# Patient Record
Sex: Female | Born: 1990 | Race: Black or African American | Hispanic: No | Marital: Married
Health system: Southern US, Community
[De-identification: ages and names within clinical notes are randomized; demographics above are authoritative.]

---
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
## 2010-03-20 ENCOUNTER — Emergency Department: Payer: Self-pay | Admitting: Emergency Medicine

## 2010-10-26 ENCOUNTER — Emergency Department: Payer: Self-pay | Admitting: Emergency Medicine

## 2011-02-06 LAB — COMPREHENSIVE METABOLIC PANEL
Alkaline Phosphatase: 61 U/L (ref 50–136)
Calcium, Total: 8.8 mg/dL (ref 8.5–10.1)
Chloride: 107 mmol/L (ref 98–107)
Co2: 23 mmol/L (ref 21–32)
EGFR (African American): >60
EGFR (Non-African Amer.): >60
Glucose: 89 mg/dL (ref 65–99)
Potassium: 4.1 mmol/L (ref 3.5–5.1)
SGOT(AST): 15 U/L (ref 15–37)
SGPT (ALT): 16 U/L
Sodium: 142 mmol/L (ref 136–145)

## 2011-02-06 LAB — DRUG SCREEN, URINE
Amphetamines, Ur Screen: NEGATIVE (ref ?–1000)
Benzodiazepine, Ur Scrn: NEGATIVE (ref ?–200)
Cannabinoid 50 Ng, Ur ~~LOC~~: NEGATIVE (ref ?–50)
MDMA (Ecstasy)Ur Screen: NEGATIVE (ref ?–500)
Methadone, Ur Screen: NEGATIVE (ref ?–300)
Phencyclidine (PCP) Ur S: NEGATIVE (ref ?–25)
Tricyclic, Ur Screen: NEGATIVE (ref ?–1000)

## 2011-02-06 LAB — CBC
MCH: 30.1 pg (ref 26.0–34.0)
MCHC: 33.6 g/dL (ref 32.0–36.0)
MCV: 90 fL (ref 80–100)
Platelet: 296 10*3/uL (ref 150–440)
RBC: 4.47 10*6/uL (ref 3.80–5.20)
RDW: 13.2 % (ref 11.5–14.5)

## 2011-02-06 LAB — PREGNANCY, URINE: Pregnancy Test, Urine: NEGATIVE m[IU]/mL

## 2011-02-06 LAB — TSH: Thyroid Stimulating Horm: 0.86 u[IU]/mL

## 2011-02-06 LAB — SALICYLATE LEVEL: Salicylates, Serum: <1.7 mg/dL

## 2011-02-06 LAB — ETHANOL: Ethanol %: <0.003 % (ref 0.000–0.080)

## 2011-02-07 ENCOUNTER — Inpatient Hospital Stay: Payer: Self-pay | Admitting: Psychiatry

## 2011-04-28 ENCOUNTER — Emergency Department: Payer: Self-pay | Admitting: Emergency Medicine

## 2011-04-28 LAB — URINALYSIS, COMPLETE
Bilirubin,UR: NEGATIVE
Ketone: NEGATIVE
Ph: 8 (ref 4.5–8.0)
Specific Gravity: 1.02 (ref 1.003–1.030)
WBC UR: 2 /HPF (ref 0–5)

## 2011-04-28 LAB — COMPREHENSIVE METABOLIC PANEL
Albumin: 3.8 g/dL (ref 3.4–5.0)
BUN: 14 mg/dL (ref 7–18)
Bilirubin,Total: 0.4 mg/dL (ref 0.2–1.0)
Calcium, Total: 8.9 mg/dL (ref 8.5–10.1)
Creatinine: 0.58 mg/dL — ABNORMAL LOW (ref 0.60–1.30)
EGFR (African American): >60
EGFR (Non-African Amer.): >60
Glucose: 92 mg/dL (ref 65–99)
Osmolality: 283 (ref 275–301)
Potassium: 4 mmol/L (ref 3.5–5.1)
SGOT(AST): 21 U/L (ref 15–37)
SGPT (ALT): 19 U/L
Sodium: 142 mmol/L (ref 136–145)

## 2011-04-28 LAB — CBC
HCT: 36.7 % (ref 35.0–47.0)
MCHC: 33 g/dL (ref 32.0–36.0)
MCV: 89 fL (ref 80–100)
Platelet: 250 10*3/uL (ref 150–440)
RDW: 13.1 % (ref 11.5–14.5)
WBC: 8.8 10*3/uL (ref 3.6–11.0)

## 2011-04-28 LAB — PREGNANCY, URINE: Pregnancy Test, Urine: NEGATIVE m[IU]/mL

## 2011-06-18 ENCOUNTER — Emergency Department: Payer: Self-pay | Admitting: *Deleted

## 2012-08-06 ENCOUNTER — Emergency Department: Payer: Self-pay | Admitting: Emergency Medicine

## 2012-08-06 LAB — CBC
HCT: 38.1 % (ref 35.0–47.0)
HGB: 12.7 g/dL (ref 12.0–16.0)
MCHC: 33.5 g/dL (ref 32.0–36.0)
MCV: 86 fL (ref 80–100)
Platelet: 341 10*3/uL (ref 150–440)
RBC: 4.43 10*6/uL (ref 3.80–5.20)
RDW: 13.2 % (ref 11.5–14.5)
WBC: 11.4 10*3/uL — ABNORMAL HIGH (ref 3.6–11.0)

## 2012-08-06 LAB — HCG, QUANTITATIVE, PREGNANCY: Beta Hcg, Quant.: 398 m[IU]/mL — ABNORMAL HIGH

## 2012-08-06 LAB — URINALYSIS, COMPLETE
Bilirubin,UR: NEGATIVE
Ph: 5 (ref 4.5–8.0)
Protein: NEGATIVE
RBC,UR: 1 /HPF (ref 0–5)
Squamous Epithelial: 8
WBC UR: 1 /HPF (ref 0–5)

## 2012-08-08 ENCOUNTER — Emergency Department: Payer: Self-pay | Admitting: Emergency Medicine

## 2012-09-19 ENCOUNTER — Emergency Department: Payer: Self-pay | Admitting: Emergency Medicine

## 2012-09-19 LAB — COMPREHENSIVE METABOLIC PANEL
Albumin: 3.4 g/dL (ref 3.4–5.0)
Anion Gap: 5 — ABNORMAL LOW (ref 7–16)
BUN: 9 mg/dL (ref 7–18)
Chloride: 107 mmol/L (ref 98–107)
Creatinine: 0.55 mg/dL — ABNORMAL LOW (ref 0.60–1.30)
EGFR (African American): >60
EGFR (Non-African Amer.): >60
Osmolality: 272 (ref 275–301)
Potassium: 3.6 mmol/L (ref 3.5–5.1)
SGOT(AST): 21 U/L (ref 15–37)
SGPT (ALT): 23 U/L (ref 12–78)
Sodium: 137 mmol/L (ref 136–145)
Total Protein: 7.5 g/dL (ref 6.4–8.2)

## 2012-09-19 LAB — CBC
MCH: 29.2 pg (ref 26.0–34.0)
MCHC: 34.6 g/dL (ref 32.0–36.0)
RBC: 4.42 10*6/uL (ref 3.80–5.20)

## 2012-09-19 LAB — URINALYSIS, COMPLETE
Bacteria: NONE SEEN
Bilirubin,UR: NEGATIVE
Glucose,UR: NEGATIVE mg/dL (ref 0–75)
Ketone: NEGATIVE
Nitrite: NEGATIVE
Ph: 6 (ref 4.5–8.0)
Protein: NEGATIVE
RBC,UR: <1 /HPF (ref 0–5)
Specific Gravity: 1.018 (ref 1.003–1.030)
Squamous Epithelial: 1

## 2012-09-19 LAB — HCG, QUANTITATIVE, PREGNANCY: Beta Hcg, Quant.: 104828 m[IU]/mL — ABNORMAL HIGH

## 2012-09-20 LAB — GC/CHLAMYDIA PROBE AMP

## 2012-09-20 LAB — WET PREP, GENITAL

## 2013-03-09 ENCOUNTER — Observation Stay: Payer: Self-pay | Admitting: Obstetrics and Gynecology

## 2013-03-09 ENCOUNTER — Emergency Department: Payer: Self-pay | Admitting: Emergency Medicine

## 2013-03-09 LAB — URINALYSIS, COMPLETE
BILIRUBIN, UR: NEGATIVE
BLOOD: NEGATIVE
GLUCOSE, UR: NEGATIVE mg/dL (ref 0–75)
LEUKOCYTE ESTERASE: NEGATIVE
Nitrite: NEGATIVE
Ph: 6 (ref 4.5–8.0)
RBC,UR: <1 /HPF (ref 0–5)
Specific Gravity: 1.019 (ref 1.003–1.030)
Squamous Epithelial: 6

## 2013-03-13 IMAGING — US US PELV - US TRANSVAGINAL
1 series · 17 of 25 positions shown · non-contrast
Comparison: none

REASON FOR EXAM: pelvic pain
COMMENTS:

[Series 1: us pelv - us transvaginal · 17 of 126 slices shown]
[im 1/126]
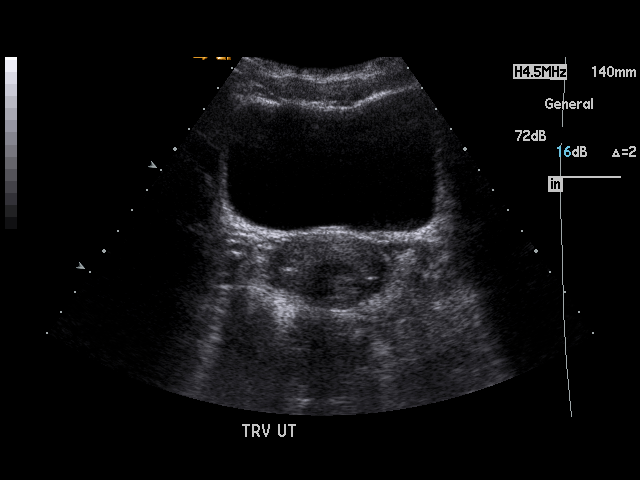
[im 11/126]
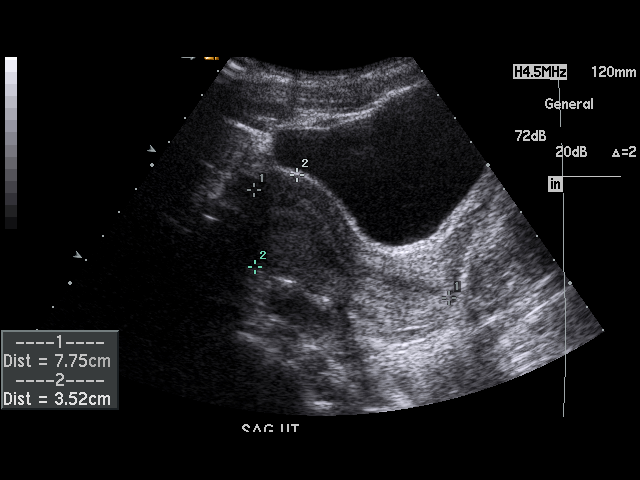
[im 16/126]
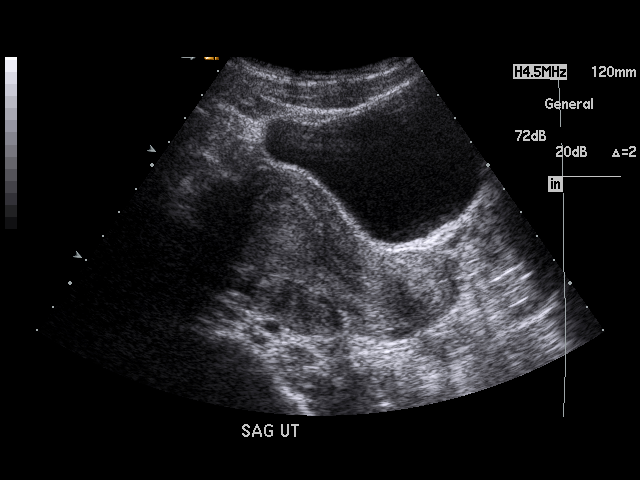
[im 27/126]
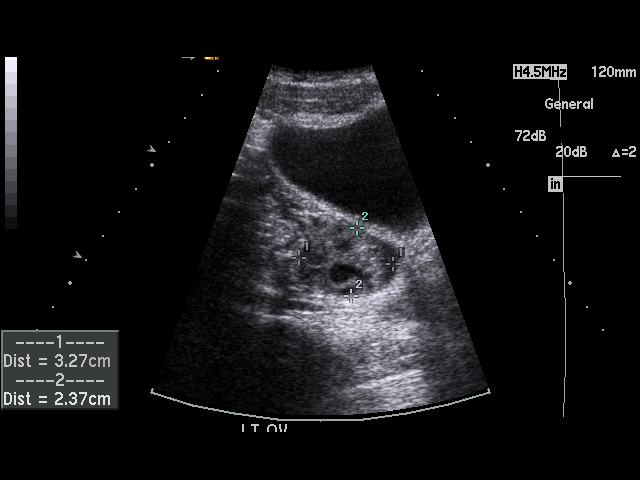
[im 32/126]
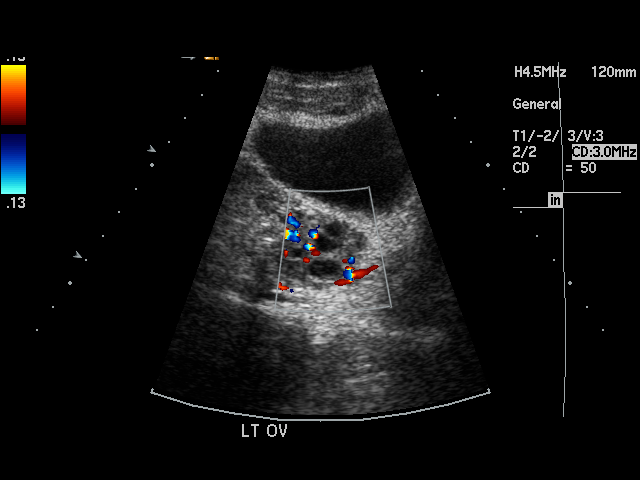
[im 42/126]
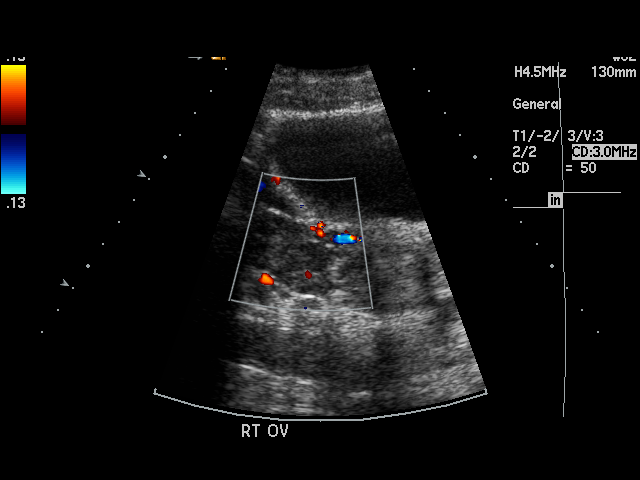
[im 47/126]
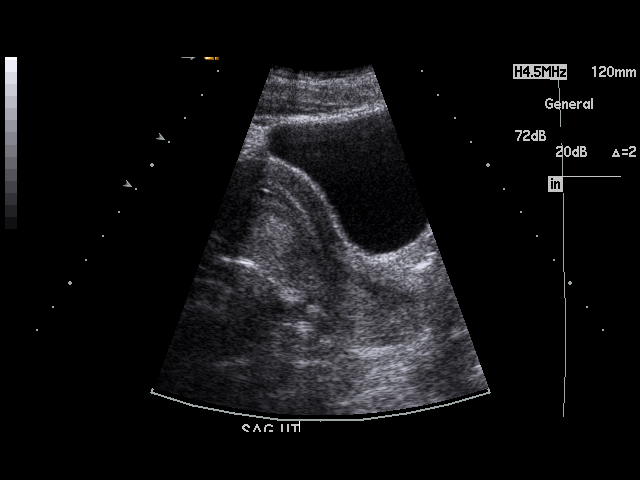
[im 58/126]
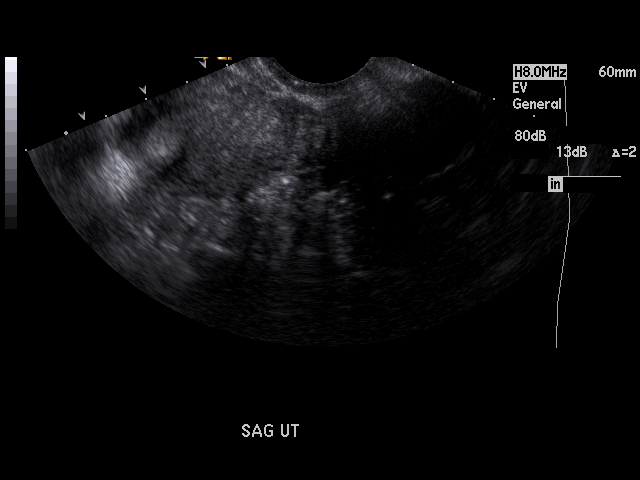
[im 63/126]
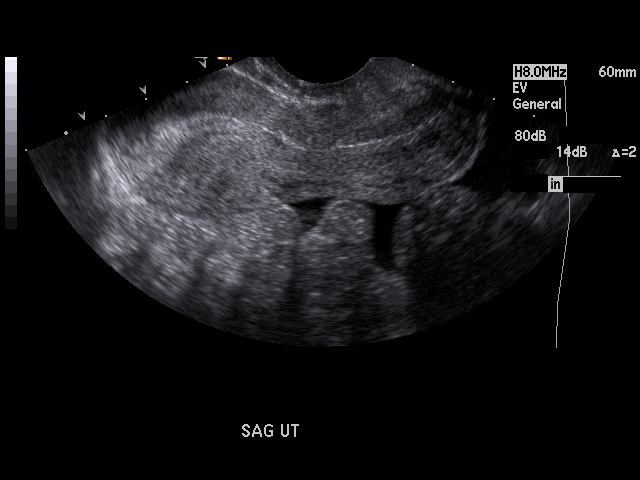
[im 68/126]
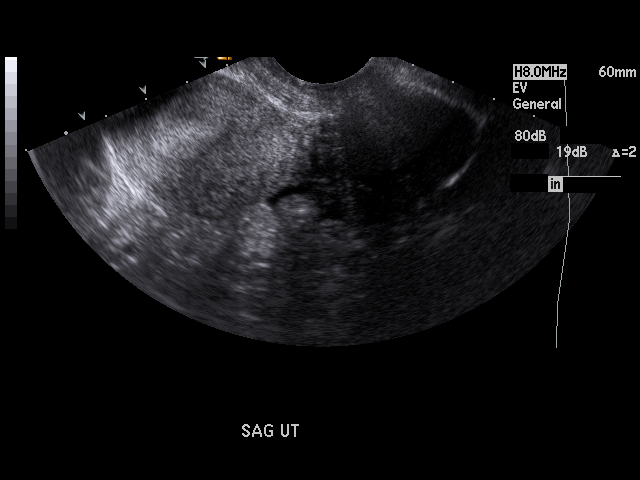
[im 79/126]
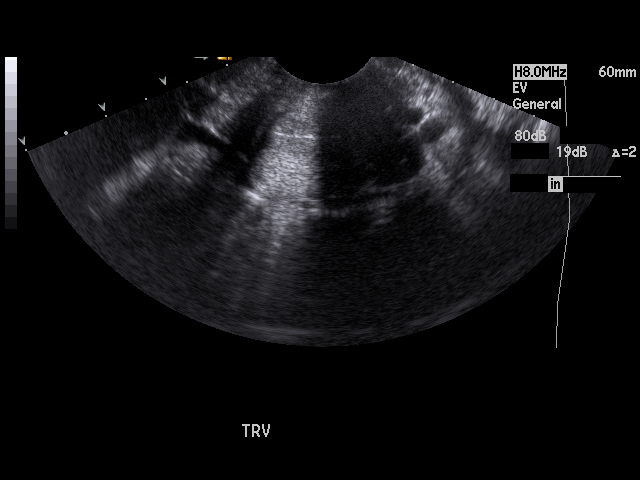
[im 84/126]
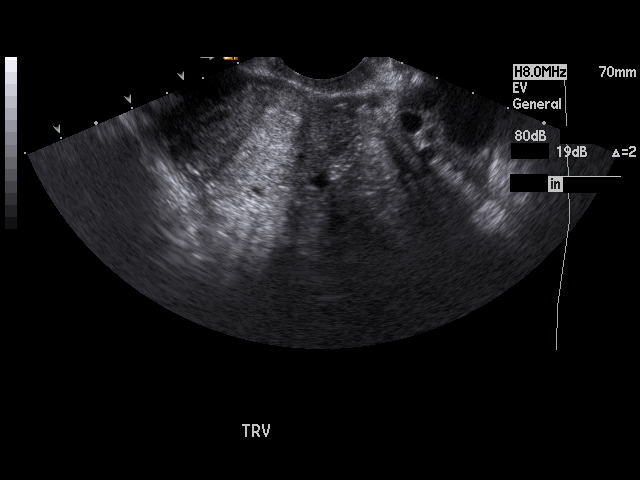
[im 94/126]
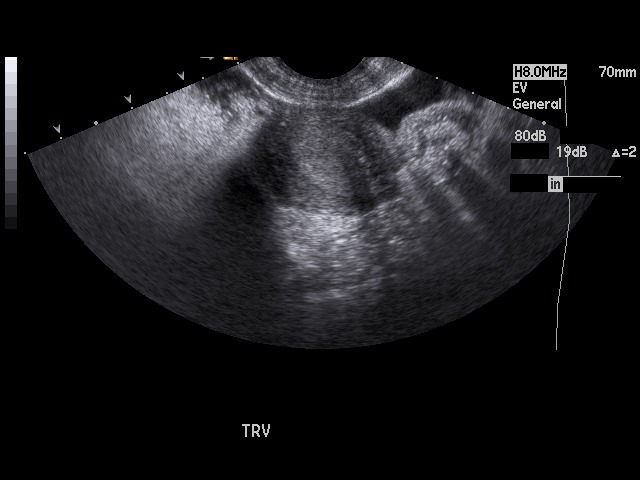
[im 99/126]
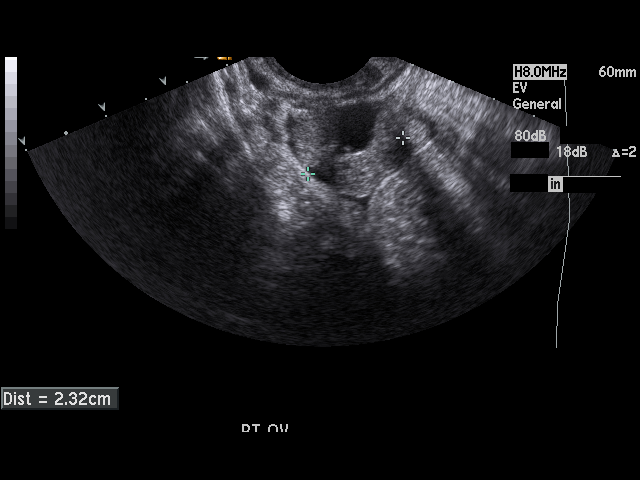
[im 110/126]
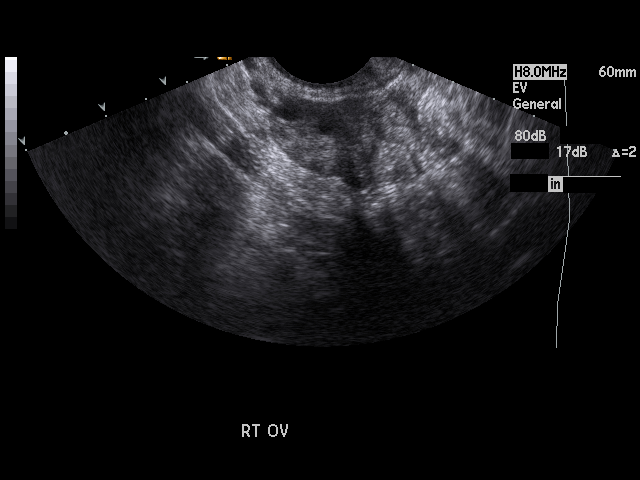
[im 115/126]
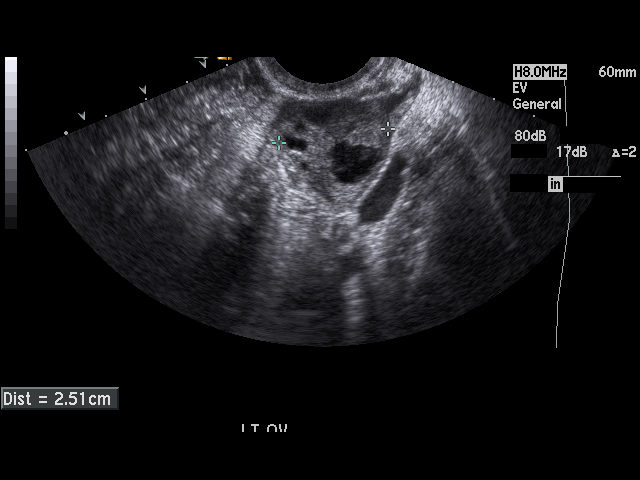
[im 126/126]
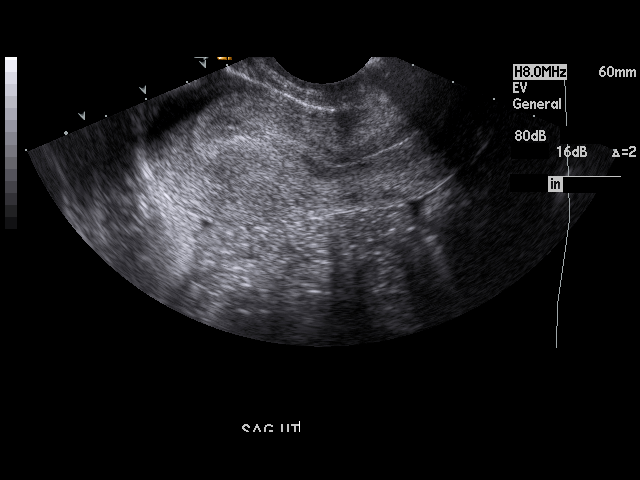

[17 of 25 positions shown; findings below may reference images not displayed]

PROCEDURE:     US  - US PELVIS EXAM W/TRANSVAGINAL  - April 28, 2011  [DATE]

RESULT:     Pelvic ultrasound is performed with transabdominal and
endovaginal technique. The uterus is midline in appearance measuring 7.75 x
3.5 to centimeters. A transverse dimension was not obtained. Endometrial
stripe thickness is normal at 5.6 mm. The ovaries are normal in size and
echotexture with the right measuring 3.31 x 2.32 x 2.18 cm and the left
measuring 3.76 x 2.27 x 2.51 cm. Multiple loops of bowel are present with
peristalsis. There is some free fluid in the right lower quadrant. Small
ovarian follicles are present.
IMPRESSION: 1. Unremarkable pelvic sonogram. Nonspecific free fluid in the pelvis. No
evidence of ovarian mass or torsion. No focal uterine abnormality evident.
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
## 2014-05-07 NOTE — Consult Note (Signed)
Brief Consult Note: Diagnosis: PTSD.   Patient was seen by consultant.   Recommend further assessment or treatment.   Orders entered.   Discussed with Attending MD.   Comments: Psychiatry: Patient made serious suicide attempt yesterday. Symptoms of PTSD. [facility] See dictation.  Electronic Signatures: Audery Amellapacs, Laquia Rosano T (MD)  (Signed 25-Jan-13 12:35)  Authored: Brief Consult Note   Last Updated: 25-Jan-13 12:35 by Audery Amellapacs, Renee Beale T (MD)

## 2014-06-22 IMAGING — US US OB < 14 WEEKS - US OB TV
1 series · 13 of 28 positions shown · non-contrast
Comparison: none

REASON FOR EXAM: 5wks pregnant with vaginal bleeding
COMMENTS:

PROCEDURE:     US  - US OB LESS THAN 14 WEEKS/W TRANS  - August 06, 2012 [DATE]
RESULT:
TECHNIQUE: Real-time transabdominal, transvaginal obstetrical ultrasound
(complete) of the maternal pelvis in a less than 14 week gestation with
image documentation was performed. Transvaginal imaging was used for
improved evaluation of the fetus and adnexa.

[Series 1: us ob < 14 weeks - us ob tv · 0.20mm/px · 13 of 64 slices shown]
[im 3/64]
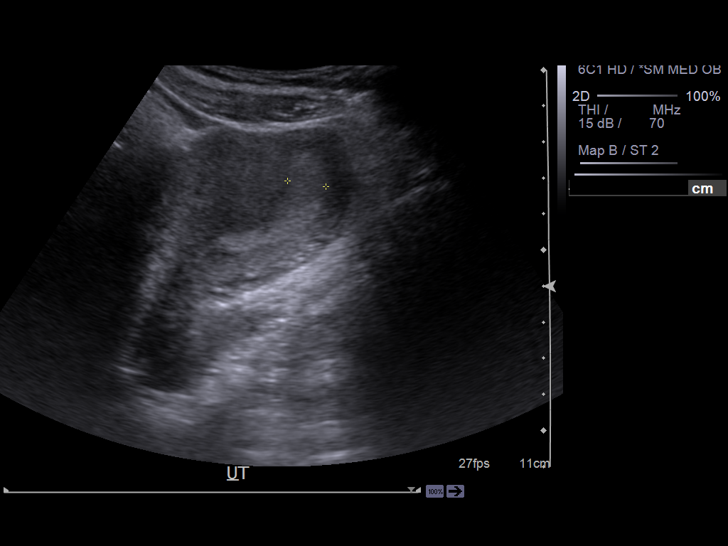
[im 8/64]
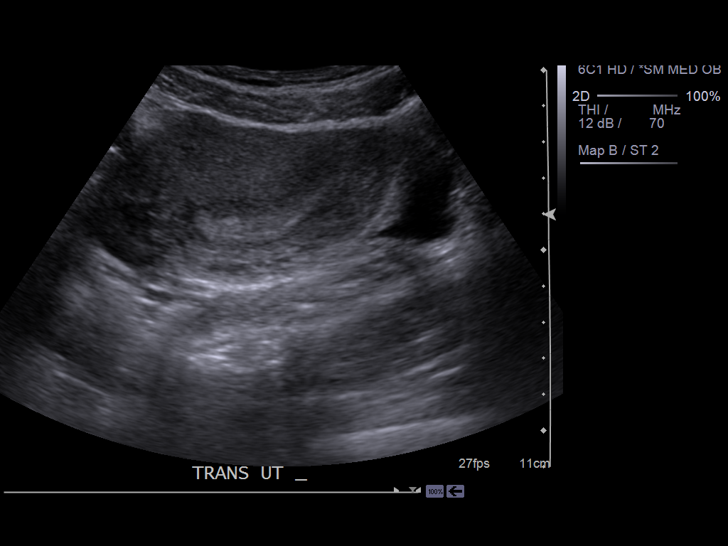
[im 12/64]
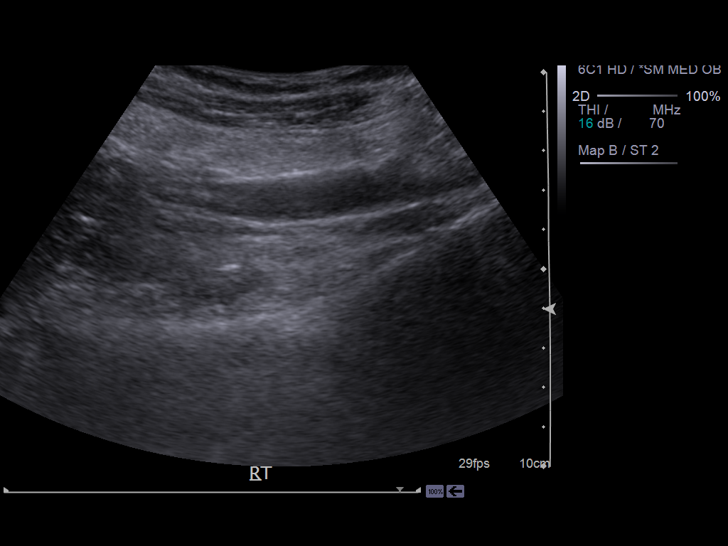
[im 17/64]
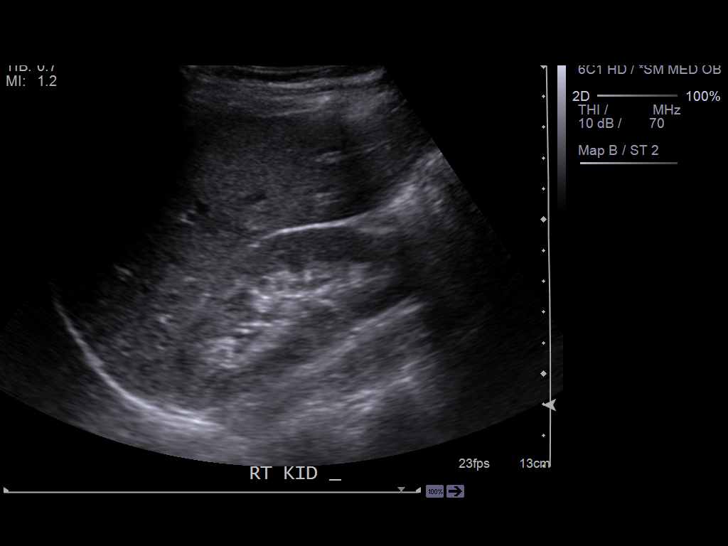
[im 22/64]
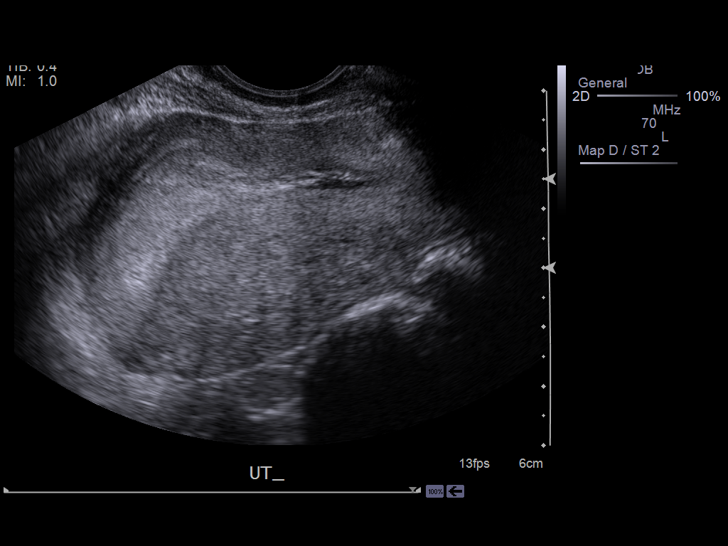
[im 26/64]
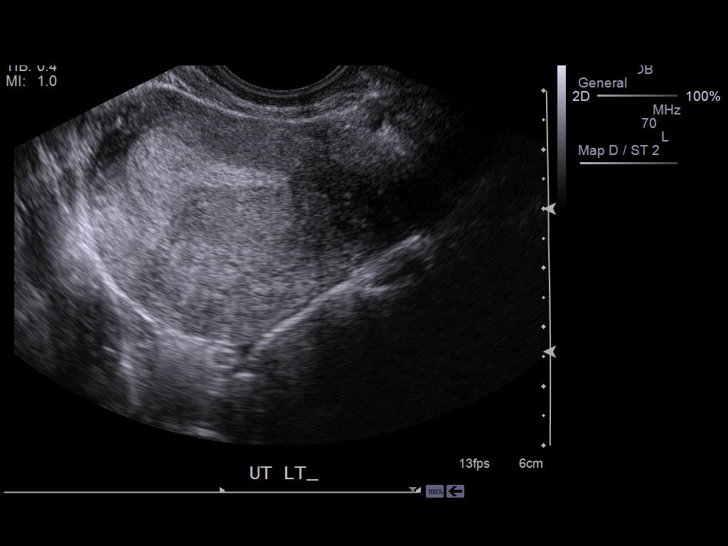
[im 33/64]
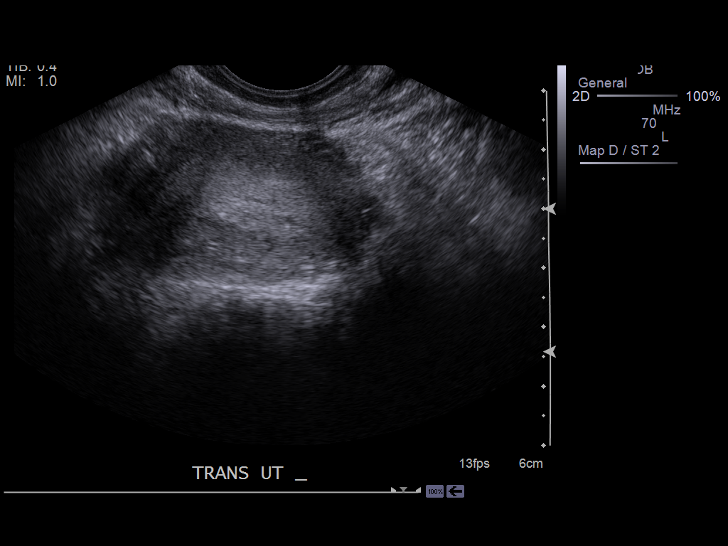
[im 38/64]
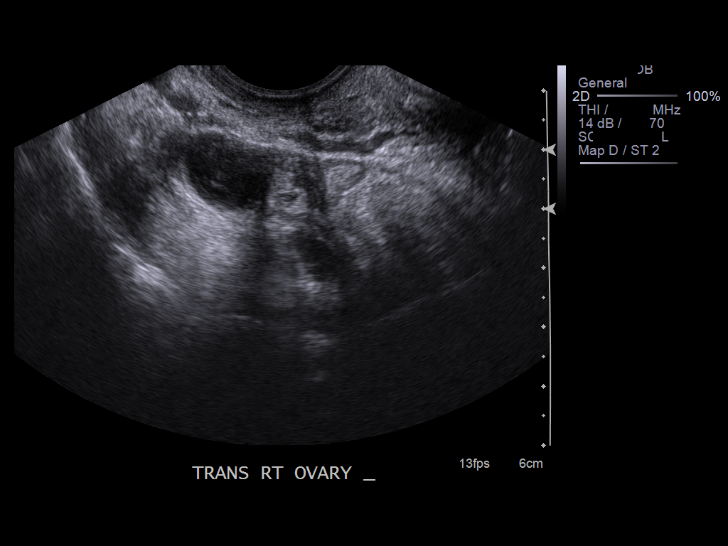
[im 43/64]
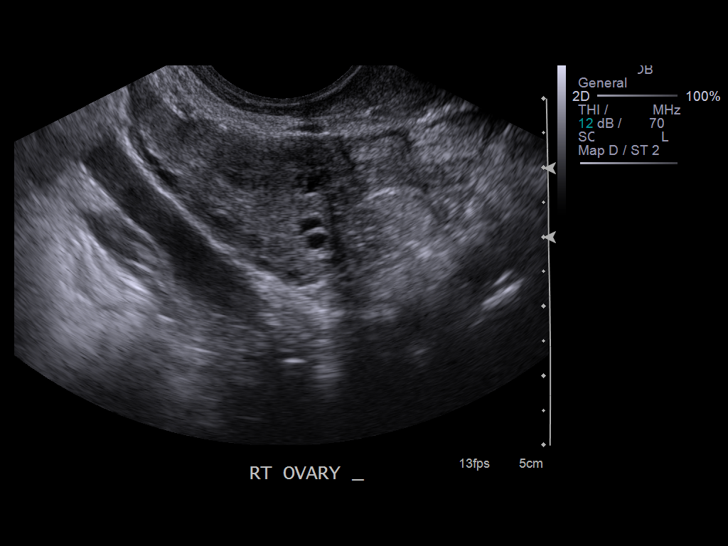
[im 47/64]
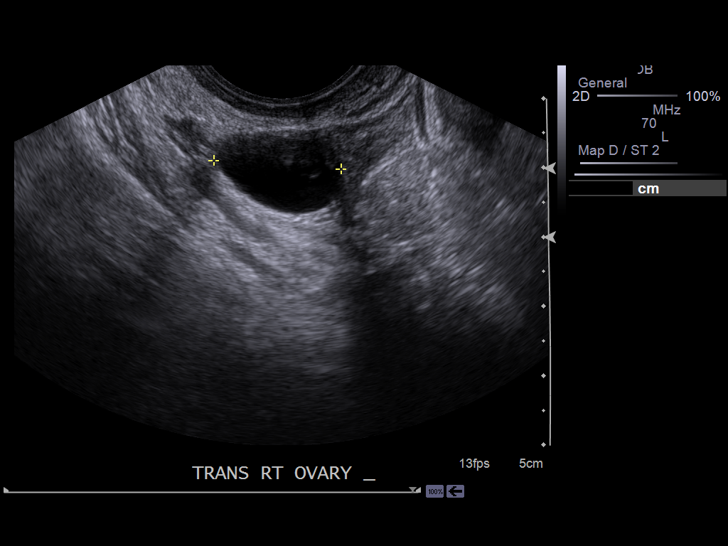
[im 52/64]
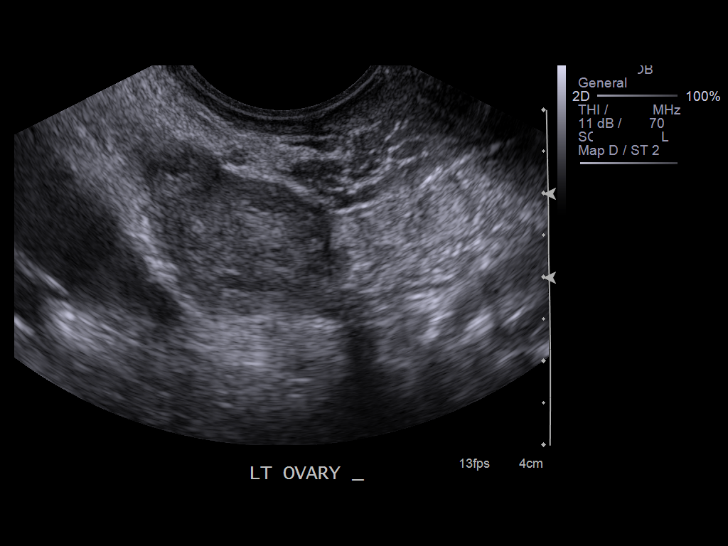
[im 57/64]
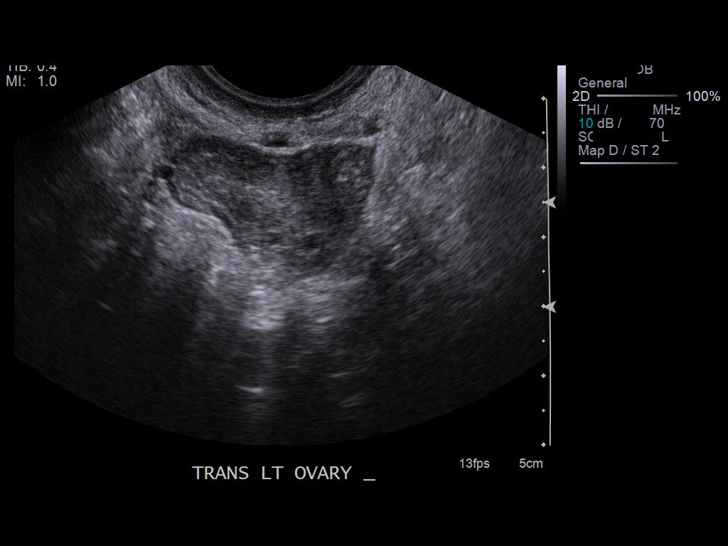
[im 61/64]
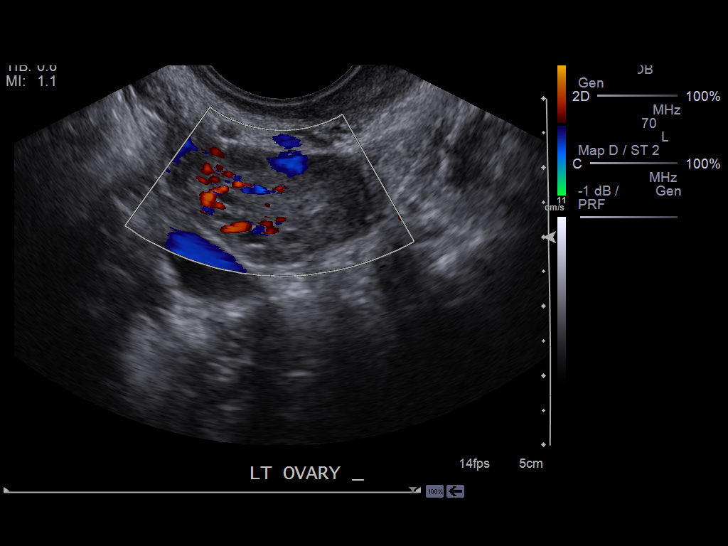

[13 of 28 positions shown; findings below may reference images not displayed]

FINDINGS: There is no evidence of an intrauterine gestational sac. The
uterus and cervix are unremarkable. Evaluation of the ovaries demonstrates a
1.8 cm septated cyst within the right ovary. The ovaries are otherwise
unremarkable. Color filling of vessels is identified within the ovaries.
Arterial and venous waveforms are also appreciated. A trace amount of free
fluid is identified within the pelvis.
IMPRESSION: 1. The patient's quantitative beta-hCG is 398. There is no evidence of an
intrauterine or extrauterine gestation appreciated. Surveillance ultrasound
is recommended as clinically warranted. An ectopic pregnancy cannot be
completely excluded until a viable intrauterine gestation is identified.
Surveillance beta-hCG is also recommended.
2. Dr. Faynier of the Emergency Department was informed of these findings
via a preliminary faxed report.

## 2014-08-06 IMAGING — US US OB < 14 WEEKS - US OB TV
1 series · 13 of 28 positions shown · non-contrast
Comparison: none

REASON FOR EXAM: 10wk pregnant with abd cramping and bleeding
COMMENTS:

[Series 1: us ob < 14 weeks - us ob tv · 0.23mm/px · 13 of 90 slices shown]
[im 4/90]
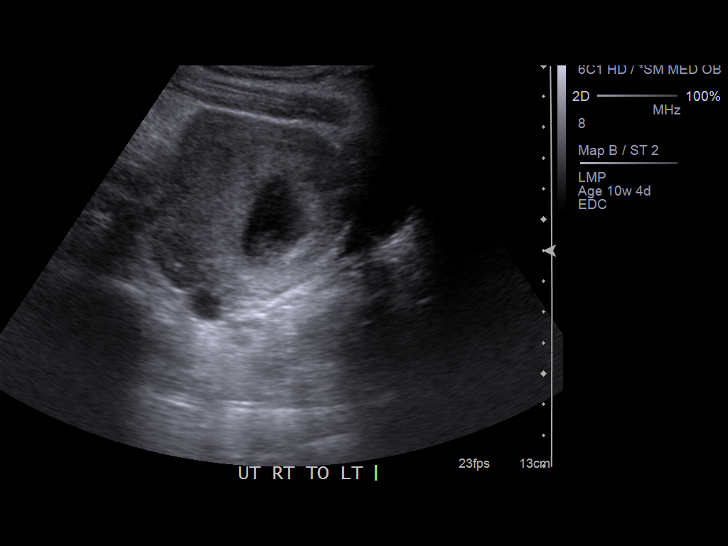
[im 10/90]
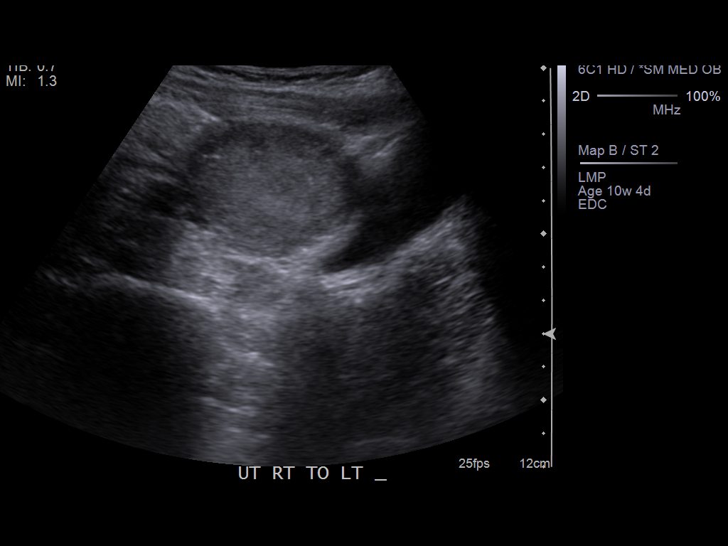
[im 17/90]
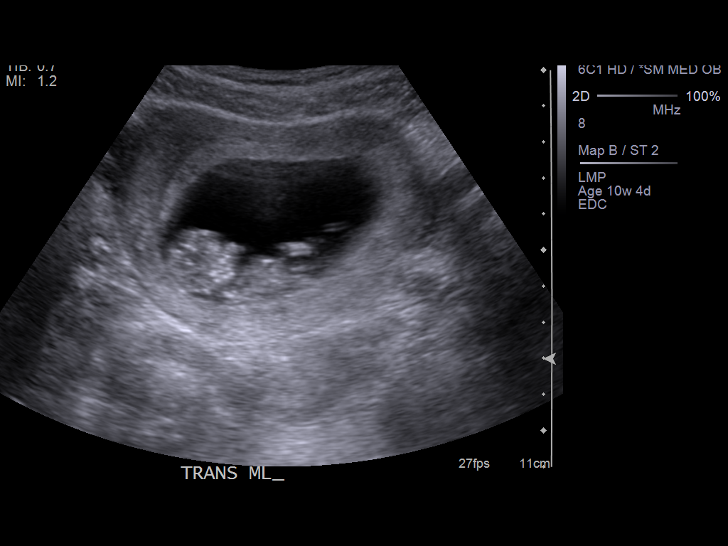
[im 24/90]
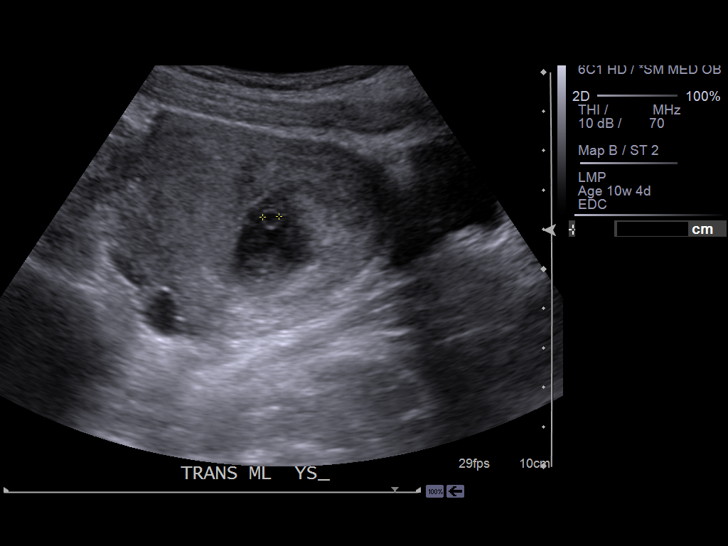
[im 30/90]
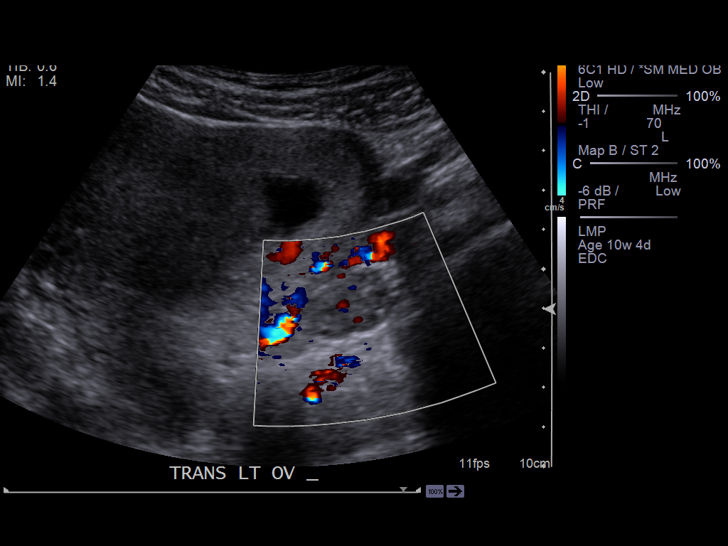
[im 37/90]
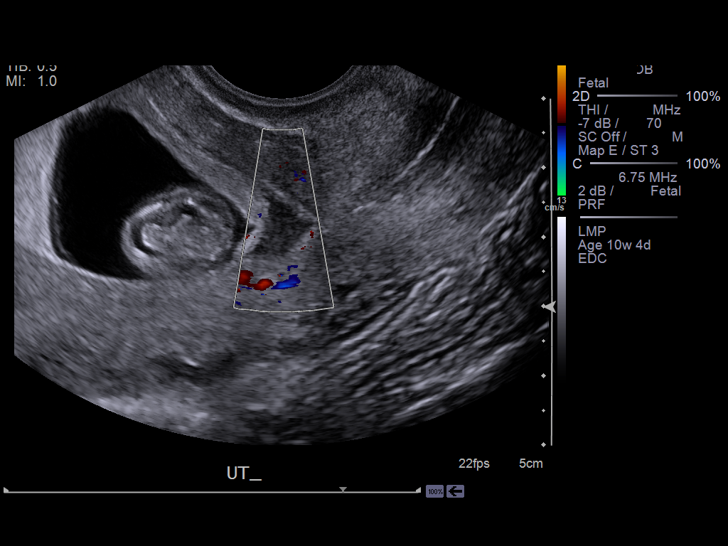
[im 47/90]
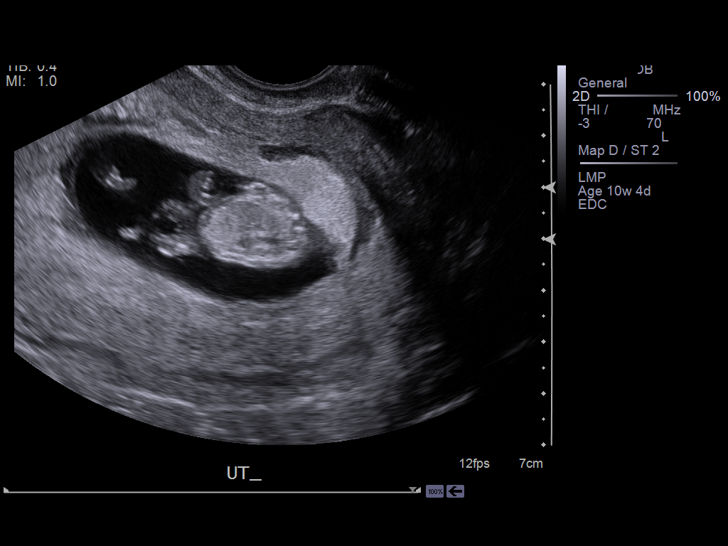
[im 53/90]
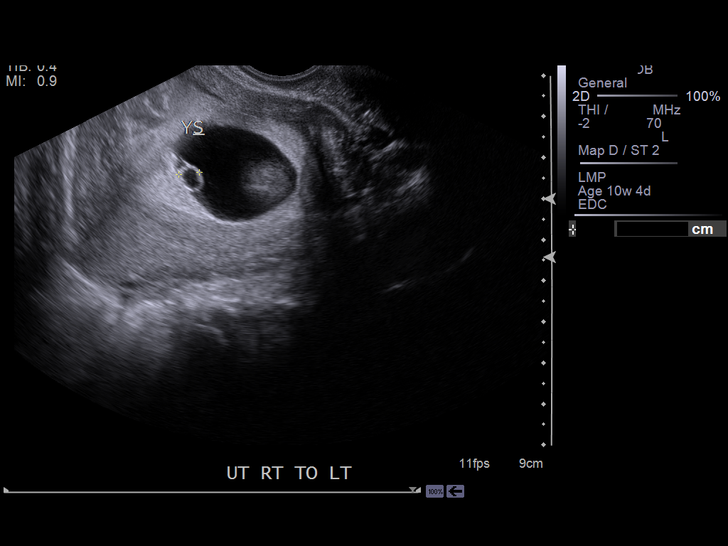
[im 60/90]
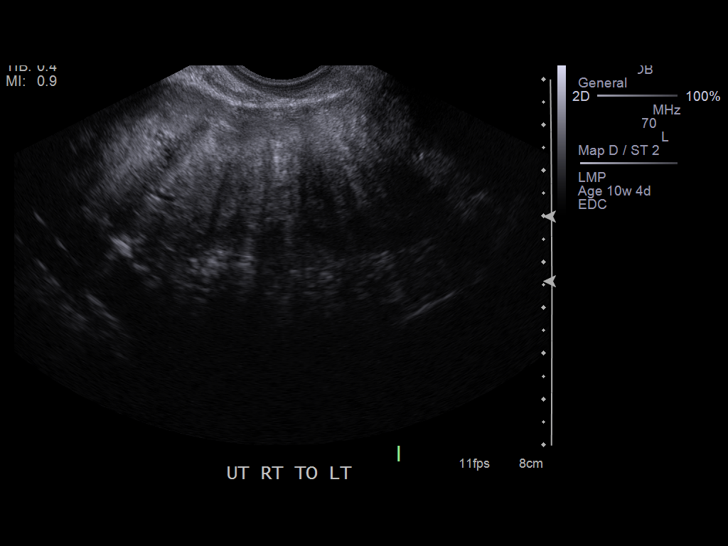
[im 66/90]
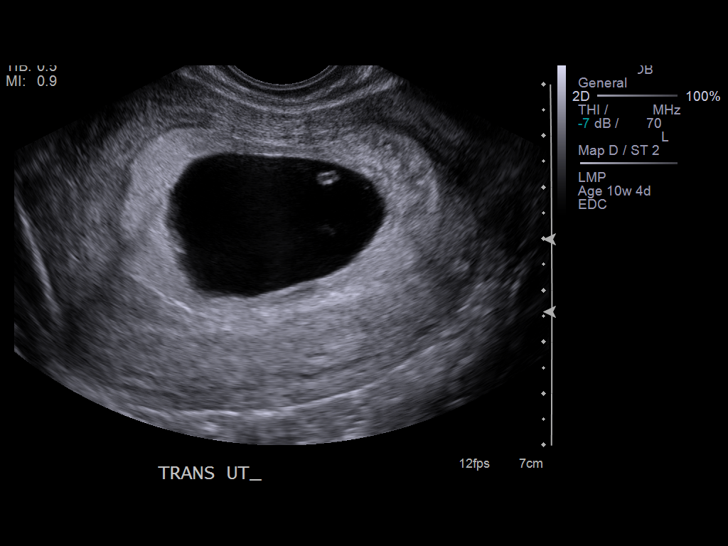
[im 73/90]
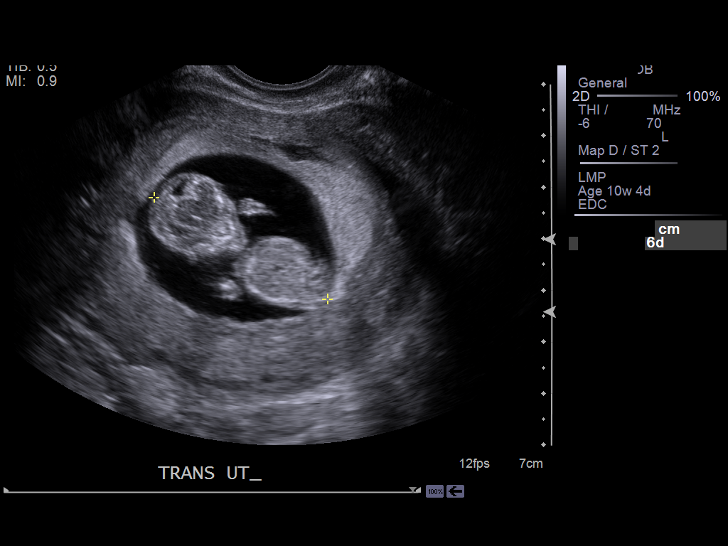
[im 80/90]
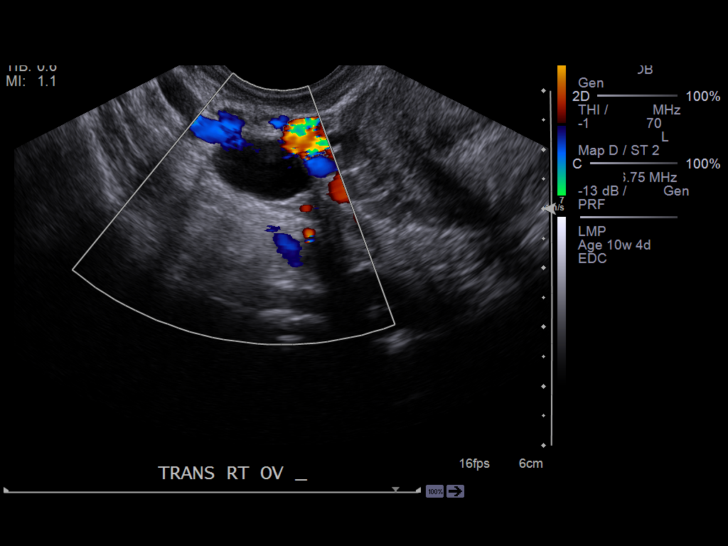
[im 86/90]
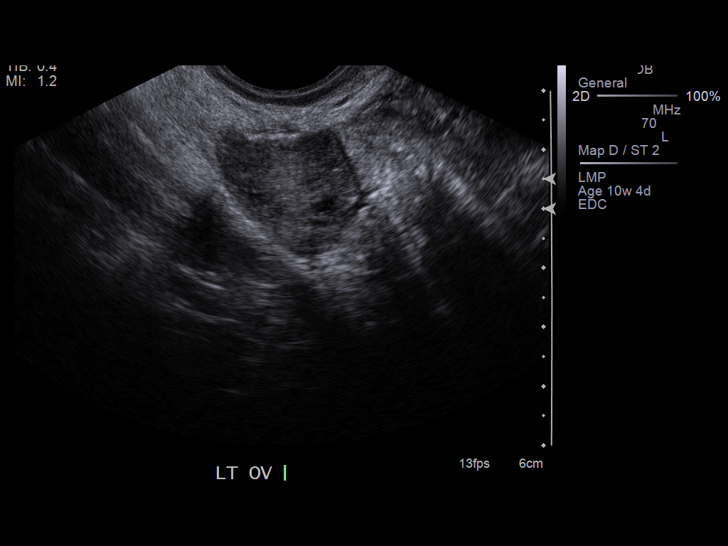

[13 of 28 positions shown; findings below may reference images not displayed]

PROCEDURE:     US  - US OB LESS THAN 14 WEEKS/W TRANS  - September 20, 2012  [DATE]

RESULT:     Transabdominal and transvaginal imaging techniques were employed
to evaluate the pelvic structures.

There is a viable IUP knee with a crown-rump length measuring 3.8 cm
corresponding to a 10 week 5 day gestation. A normal appearing yolk sac is
evident. A fetal cardiac rate of 100 and 68 beats per minute was
demonstrated. There are are 2 areas of decreased echogenicity suspicious for
subchorionic hemorrhage is noted. Anteriorly a fluid collection measures
x 1.4 x 0.4 cm. Inferiorly a fluid collection measures 0.7 x 1.6 x 1.1 cm.

The maternal right ovary measures 2.7 x 1.5 x 1.8 cm and contains a cyst
measuring 1.6 x 1.4 x 1.1 cm. The left ovary is normal in echotexture and
measures 2.1 x 0.8 x 1.7 cm. No significant free fluid is demonstrated in
the pelvis.
IMPRESSION: 1. There is a viable IUP with estimated gestational age of 10 weeks 5 days
+ / - 7 days. The estimated date of confinement is April 13, 2013.
2. Two areas of decreased echogenicity consistent with subchorionic
hemorrhages are present as described. The largest measures 1.6 cm in
greatest dimension.
3. There is a simple appearing cyst associated with the right ovary
measuring 1.6 cm in greatest dimension.

A preliminary report was sent to the [HOSPITAL] the conclusion
of the study.

[REDACTED]
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
